# Patient Record
Sex: Male | Born: 1965 | Race: Black or African American | Hispanic: No | Marital: Married | Smoking: Former smoker
Health system: Southern US, Community
[De-identification: ages and names within clinical notes are randomized; demographics above are authoritative.]

## PROBLEM LIST (undated history)

## (undated) DIAGNOSIS — R569 Unspecified convulsions: Secondary | ICD-10-CM

## (undated) DIAGNOSIS — F419 Anxiety disorder, unspecified: Secondary | ICD-10-CM

---

## 2016-11-28 DIAGNOSIS — G40209 Localization-related (focal) (partial) symptomatic epilepsy and epileptic syndromes with complex partial seizures, not intractable, without status epilepticus: Secondary | ICD-10-CM | POA: Insufficient documentation

## 2017-02-27 DIAGNOSIS — M25562 Pain in left knee: Secondary | ICD-10-CM

## 2017-02-27 DIAGNOSIS — G8929 Other chronic pain: Secondary | ICD-10-CM | POA: Insufficient documentation

## 2017-02-27 DIAGNOSIS — M25561 Pain in right knee: Secondary | ICD-10-CM

## 2017-12-05 ENCOUNTER — Encounter (HOSPITAL_COMMUNITY): Payer: Self-pay | Admitting: Emergency Medicine

## 2017-12-05 ENCOUNTER — Emergency Department (HOSPITAL_COMMUNITY): Payer: Medicaid Other

## 2017-12-05 DIAGNOSIS — R079 Chest pain, unspecified: Secondary | ICD-10-CM | POA: Diagnosis present

## 2017-12-05 DIAGNOSIS — J189 Pneumonia, unspecified organism: Secondary | ICD-10-CM | POA: Diagnosis not present

## 2017-12-05 DIAGNOSIS — Z87891 Personal history of nicotine dependence: Secondary | ICD-10-CM | POA: Diagnosis not present

## 2017-12-05 LAB — CBC
HEMATOCRIT: 41.1 % (ref 39.0–52.0)
Hemoglobin: 15 g/dL (ref 13.0–17.0)
MCH: 30.9 pg (ref 26.0–34.0)
MCHC: 36.5 g/dL — AB (ref 30.0–36.0)
MCV: 84.7 fL (ref 78.0–100.0)
Platelets: 170 10*3/uL (ref 150–400)
RBC: 4.85 MIL/uL (ref 4.22–5.81)
RDW: 13.7 % (ref 11.5–15.5)
WBC: 6.6 10*3/uL (ref 4.0–10.5)

## 2017-12-05 LAB — BASIC METABOLIC PANEL
Anion gap: 14 (ref 5–15)
BUN: 10 mg/dL (ref 6–20)
CHLORIDE: 105 mmol/L (ref 101–111)
CO2: 19 mmol/L — ABNORMAL LOW (ref 22–32)
Calcium: 9.7 mg/dL (ref 8.9–10.3)
Creatinine, Ser: 0.82 mg/dL (ref 0.61–1.24)
GFR calc Af Amer: >60 mL/min (ref >60)
GFR calc non Af Amer: >60 mL/min (ref >60)
GLUCOSE: 118 mg/dL — AB (ref 65–99)
POTASSIUM: 3.3 mmol/L — AB (ref 3.5–5.1)
Sodium: 138 mmol/L (ref 135–145)

## 2017-12-05 LAB — I-STAT TROPONIN, ED: Troponin i, poc: 0.03 ng/mL (ref 0.00–0.08)

## 2017-12-05 NOTE — ED Notes (Signed)
Pt walking out to car and will return

## 2017-12-05 NOTE — ED Triage Notes (Signed)
Brought by ems for c/o central chest pain.  Describes as a throbbing.  Was seen at [facility] for the same earlier today.  Was told everything was okay and discharged.  Denies having any pain at this time.  Does endorse smoking some synthetic marijuana this past weekend and has felt bad since.

## 2017-12-05 NOTE — ED Notes (Signed)
Brought back for reassessment.  Reports walking to the car.  Per family had to "take it slow" then fell back into the chair when they returned to the waiting room.  Per patient continuing to have chest pain.  Encouraged to stay seated in the waiting room until he can get a room.

## 2017-12-05 NOTE — ED Notes (Addendum)
Pt was lying on ground outside on pavement coming down hill to ED.  Visitor reports that pt had increased sob while walking back from car and said he needed to sit down.  States pt sat down on pavement and then layed down.  Denies patient falling or hitting head.  Pt states he has continued chest pain and sob.  Pt taken back to triage for repeat EKG.

## 2017-12-06 ENCOUNTER — Emergency Department (HOSPITAL_COMMUNITY)
Admission: EM | Admit: 2017-12-06 | Discharge: 2017-12-06 | Disposition: A | Discharge status: Home / Self Care | Payer: Medicaid Other | Attending: Emergency Medicine | Admitting: Emergency Medicine

## 2017-12-06 ENCOUNTER — Emergency Department (HOSPITAL_COMMUNITY): Payer: Medicaid Other

## 2017-12-06 DIAGNOSIS — R079 Chest pain, unspecified: Secondary | ICD-10-CM

## 2017-12-06 DIAGNOSIS — R0602 Shortness of breath: Secondary | ICD-10-CM

## 2017-12-06 DIAGNOSIS — R042 Hemoptysis: Secondary | ICD-10-CM

## 2017-12-06 DIAGNOSIS — J181 Lobar pneumonia, unspecified organism: Secondary | ICD-10-CM

## 2017-12-06 DIAGNOSIS — J189 Pneumonia, unspecified organism: Secondary | ICD-10-CM

## 2017-12-06 HISTORY — DX: Unspecified convulsions: R56.9

## 2017-12-06 LAB — HEPATIC FUNCTION PANEL
ALBUMIN: 4 g/dL (ref 3.5–5.0)
ALK PHOS: 146 U/L — AB (ref 38–126)
ALT: 30 U/L (ref 17–63)
AST: 26 U/L (ref 15–41)
Bilirubin, Direct: <0.1 mg/dL — ABNORMAL LOW (ref 0.1–0.5)
TOTAL PROTEIN: 7.5 g/dL (ref 6.5–8.1)
Total Bilirubin: 0.6 mg/dL (ref 0.3–1.2)

## 2017-12-06 LAB — LIPASE, BLOOD: Lipase: 28 U/L (ref 11–51)

## 2017-12-06 LAB — I-STAT TROPONIN, ED: Troponin i, poc: 0.01 ng/mL (ref 0.00–0.08)

## 2017-12-06 MED ORDER — HYDROCODONE-ACETAMINOPHEN 5-325 MG PO TABS: 1.0000 | ORAL_TABLET | ORAL | 0 refills | Status: DC | PRN

## 2017-12-06 MED ORDER — LEVOFLOXACIN 750 MG PO TABS: 750.0000 mg | ORAL_TABLET | Freq: Every day | ORAL | 0 refills | Status: AC

## 2017-12-06 MED ORDER — IOPAMIDOL (ISOVUE-370) INJECTION 76%
100.0000 mL | Freq: Once | INTRAVENOUS | Status: AC | PRN
  Administered 2017-12-06: 100 mL via INTRAVENOUS

## 2017-12-06 MED ORDER — IOPAMIDOL (ISOVUE-370) INJECTION 76%
INTRAVENOUS | Status: AC
  Filled 2017-12-06: qty 100

## 2017-12-06 MED ORDER — GI COCKTAIL ~~LOC~~
30.0000 mL | Freq: Once | ORAL | Status: AC
  Administered 2017-12-06: 30 mL via ORAL
  Filled 2017-12-06: qty 30

## 2017-12-06 MED ORDER — LEVOFLOXACIN 750 MG PO TABS
750.0000 mg | ORAL_TABLET | Freq: Once | ORAL | Status: AC
  Administered 2017-12-06: 750 mg via ORAL
  Filled 2017-12-06: qty 1

## 2017-12-06 MED ORDER — NITROGLYCERIN 0.4 MG SL SUBL: 0.4000 mg | SUBLINGUAL_TABLET | SUBLINGUAL | Status: DC | PRN

## 2017-12-06 NOTE — Discharge Instructions (Signed)
Your workup today showed evidence of pneumonia.  I suspect is the cause of the chest pain, shortness of breath, and coughing up blood.  We did not find evidence of blood clot, injury to your esophagus, or evidence of an acute heart injury.  We had a shared decision making conversation about admitting you versus discharge given your elevated risks for heart trouble however, given the discovery of pneumonia and your improvement in symptoms we decided you are safe for discharge home.  Please follow-up with a primary doctor in the next few days and return to the nearest emergency department if any symptoms change or worsen.

## 2017-12-06 NOTE — ED Notes (Signed)
Pt ambulatory in hallway with no assistance. SpO2 97% before ambulating, fell to 93% while ambulating, returned to 97% at bedside.

## 2017-12-06 NOTE — ED Notes (Signed)
EDP at bedside at this time.  

## 2017-12-06 NOTE — ED Notes (Signed)
E-signature not functioning at this time.  

## 2017-12-06 NOTE — ED Provider Notes (Signed)
[facility] EMERGENCY DEPARTMENT Provider Note   CSN: [id number] Arrival date & time: 12/05/17  2124     History   Chief Complaint Chief Complaint  Patient presents with  . Chest Pain    HPI Adam Kramer is a 52 y.o. male.  The history is provided by the patient and medical records. No language interpreter was used.  Chest Pain   This is a new problem. The current episode started more than 2 days ago. The problem occurs constantly. The problem has not changed since onset.The pain is associated with exertion, coughing, breathing and eating. The pain is present in the substernal region. The pain is at a severity of 9/10. The pain is severe. The quality of the pain is described as sharp and stabbing. The pain radiates to the left neck and right neck. Duration of episode(s) is 4 days. The symptoms are aggravated by exertion and deep breathing. Associated symptoms include cough, diaphoresis, hemoptysis, irregular heartbeat, palpitations and shortness of breath. Pertinent negatives include no abdominal pain, no back pain, no exertional chest pressure, no fever, no headaches, no lower extremity edema, no malaise/fatigue, no nausea, no numbness, no syncope and no vomiting. He has tried nothing for the symptoms. The treatment provided no relief. Risk factors include male gender and smoking/tobacco exposure.    Past Medical History:  Diagnosis Date  . Seizures (HCC)     There are no active problems to display for this patient.   History reviewed. No pertinent surgical history.      Home Medications    Prior to Admission medications   Not on File    Family History No family history on file.  Social History Social History   Tobacco Use  . Smoking status: Former Smoker    Types: Cigarettes    Last attempt to quit: 12/02/2017    Years since quitting: 0.0  . Smokeless tobacco: Never Used  Substance Use Topics  . Alcohol use: Yes    Comment: occasionally  .  Drug use: Yes    Comment: synthetic marijuana     Allergies   Patient has no allergy information on record.   Review of Systems Review of Systems  Constitutional: Positive for chills and diaphoresis. Negative for fatigue, fever and malaise/fatigue.  HENT: Negative for congestion.   Eyes: Negative for visual disturbance.  Respiratory: Positive for cough, hemoptysis, chest tightness and shortness of breath. Negative for choking, wheezing and stridor.   Cardiovascular: Positive for chest pain and palpitations. Negative for leg swelling and syncope.  Gastrointestinal: Negative for abdominal pain, diarrhea, nausea and vomiting.  Genitourinary: Negative for dysuria.  Musculoskeletal: Positive for neck pain. Negative for back pain and neck stiffness.  Neurological: Negative for light-headedness, numbness and headaches.  Psychiatric/Behavioral: Negative for agitation.  All other systems reviewed and are negative.    Physical Exam Updated Vital Signs BP (!) 161/93   Pulse 81   Temp 97.9 F (36.6 C) (Oral)   Resp 18   Ht 5\' 11"  (1.803 m)   Wt 79.4 kg (175 lb)   SpO2 100%   BMI 24.41 kg/m   Physical Exam  Constitutional: He is oriented to person, place, and time. He appears well-developed and well-nourished. No distress.  HENT:  Head: Normocephalic.  Nose: Nose normal.  Mouth/Throat: Oropharynx is clear and moist.  Eyes: Pupils are equal, round, and reactive to light. Conjunctivae and EOM are normal.  Neck: Normal range of motion. Neck supple.  Cardiovascular: Normal rate and  intact distal pulses.  No murmur heard. Pulmonary/Chest: Effort normal and breath sounds normal. No stridor. No respiratory distress. He has no wheezes. He exhibits no tenderness.  Abdominal: Soft. Bowel sounds are normal. He exhibits no distension. There is no tenderness.  Musculoskeletal: Normal range of motion. He exhibits no edema or tenderness.  Neurological: He is alert and oriented to person,  place, and time. No sensory deficit. He exhibits normal muscle tone.  Skin: Skin is warm. Capillary refill takes less than 2 seconds. He is not diaphoretic. No erythema. No pallor.  Psychiatric: He has a normal mood and affect.  Nursing note and vitals reviewed.    ED Treatments / Results  Labs (all labs ordered are listed, but only abnormal results are displayed) Labs Reviewed  BASIC METABOLIC PANEL - Abnormal; Notable for the following components:      Result Value   Potassium 3.3 (*)    CO2 19 (*)    Glucose, Bld 118 (*)    All other components within normal limits  CBC - Abnormal; Notable for the following components:   MCHC 36.5 (*)    All other components within normal limits  HEPATIC FUNCTION PANEL  LIPASE, BLOOD  I-STAT TROPONIN, ED  I-STAT TROPONIN, ED    EKG EKG Interpretation  Date/Time:  Wednesday December 05 2017 23:35:26 EDT Ventricular Rate:  83 PR Interval:  156 QRS Duration: 96 QT Interval:  354 QTC Calculation: 415 R Axis:   60 Text Interpretation:  Normal sinus rhythm Right atrial enlargement Left ventricular hypertrophy T wave abnormality, consider inferior ischemia Abnormal ECG When comapred to prior, new t wave inversin in lead AVF.  No STEMI Confirmed by Theda Belfast ([id number]) on 12/06/2017 7:24:03 AM   Radiology Dg Chest 2 View  Result Date: 12/05/2017 CLINICAL DATA:  Chest pain EXAM: CHEST - 2 VIEW COMPARISON:  None. FINDINGS: Borderline cardiomegaly. Both lungs are clear. The visualized skeletal structures are unremarkable. IMPRESSION: No active cardiopulmonary disease. Electronically Signed   By: Jasmine Pang M.D.   On: 12/05/2017 22:19   Ct Angio Chest Pe W And/or Wo Contrast  Result Date: 12/06/2017 CLINICAL DATA:  Shortness of breath. Pulmonary embolism suspected. Cough and hemoptysis after smoking synthetic cannabis EXAM: CT ANGIOGRAPHY CHEST WITH CONTRAST TECHNIQUE: Multidetector CT imaging of the chest was performed using the standard  protocol during bolus administration of intravenous contrast. Multiplanar CT image reconstructions and MIPs were obtained to evaluate the vascular anatomy. CONTRAST:  ISOVUE-370 IOPAMIDOL (ISOVUE-370) INJECTION 76% COMPARISON:  None. FINDINGS: Cardiovascular: Satisfactory opacification of the pulmonary arteries to the segmental level. No evidence of pulmonary embolism. Normal heart size. No pericardial effusion. Atherosclerotic calcifications seen along the left circumflex. Mediastinum/Nodes: Right hilar adenopathy, presumably related to the ipsilateral airspace disease. Nodes measure up to 13 mm in short axis Lungs/Pleura: Clustered airspace nodules in the right lower lobe. There is inhalational history but would expect more generalized airspace disease for a pneumonitis. Mild paraseptal emphysema. Mild generalized increased density of the lower lobes favoring atelectasis. Upper Abdomen: Negative Musculoskeletal: No acute finding Review of the MIP images confirms the above findings. IMPRESSION: 1. Right lower lobe airspace disease which could reflect pneumonia or alveolar hemorrhage given the clinical history. Ipsilateral hilar adenopathy favors an infectious/inflammatory process. These findings are radiographic occult but chest x-ray follow-up in 3-4 weeks is still recommended. 2. Coronary atherosclerotic calcification, mild but notable for age. Electronically Signed   By: Marnee Spring M.D.   On: 12/06/2017 08:50  Procedures Procedures (including critical care time)  Medications Ordered in ED Medications  gi cocktail (Maalox,Lidocaine,Donnatal) (30 mLs Oral Given 12/06/17 0748)  iopamidol (ISOVUE-370) 76 % injection 100 mL (100 mLs Intravenous Contrast Given 12/06/17 0820)  levofloxacin (LEVAQUIN) tablet 750 mg (750 mg Oral Given 12/06/17 1115)     Initial Impression / Assessment and Plan / ED Course  I have reviewed the triage vital signs and the nursing notes.  Pertinent labs & imaging  results that were available during my care of the patient were reviewed by me and considered in my medical decision making (see chart for details).     Josie SaundersLacy Makin is a 52 y.o. male with a past medical history significant for seizures and hypertension who presents with chest pain.  Patient reports that for the last few days he has had severe chest pain and shortness of breath.  He does report that he smoked a synthetic marijuana several days ago and his symptoms have been persistent since that time.  He reports it is worse with eating and drinking and he has had severe coughing episodes with hemoptysis.  He describes his pain as 10 out of 10 and is primarily sharp.  He says it is also exertional and pleuritic.  He denies any history of DVT or PE.  He denies any prior trauma.  He does report some palpitations associated.  He also reports some chills but no fevers.  He reports his pain gets up to a 10 out of 10 severity but is currently 9 out of 10.  He denies nausea, vomiting but does report some diaphoresis.  He reports the pain radiates into his neck.  He reports that he has had increase in seizures over the last few days but has had no trauma to his knowledge.  He reports going to an urgent care several days ago and had a positive troponin and was given a nitroglycerin patch that improved his pain.  He reports that he was seen yesterday at a different hospital and had several troponins that were negative and patient was discharged with reassurances.  Patient reports his pain is continued and he is having difficulty with ambulation due to the chest pain shortness of breath prompting him to seek evaluation.  Next  On exam, patient did not have crepitance in his neck or chest.  Lungs were clear.  Chest was nontender.  Patient had no lower extremity edema or abnormal pulses in his arms or legs.  Abdomen was nontender chest was nontender.  Patient overall appears well.  When compared to prior EKG patient had a  new T wave inversion.  No STEMI was seen.   Patient's initial CBC shows no anemia or leukocytosis.  Metabolic panel showed slight hypokalemia but otherwise normal kidney function.  Initial troponin was negative.  Next  Based on patient's report of hemoptysis and chest pain that is sharp after having the coughing fits from the synthetic marijuana, I am concerned patient may have had a esophageal or airway injury.  Patient will have CT scan to further evaluate.  Patient will also have a lipase and hepatic function panel given the upper abdominal/lower chest pain.    Anticipate reassessment after workup however patient will be given a GI cocktail and nitroglycerin.  Patient reports his pain is improved during his ED stay.  CT scan revealed evidence of pneumonia with the alveolar hemorrhage likely causing the hemoptysis.  No evidence of pulmonary embolism seen.  Delta troponin was negative.  Patient  given antibiotics for the pneumonia.  I suspect this is the cause of his symptoms.  A shared decision making conversation was held as to admission given a heart score of 4 despite his negative delta troponin and patient would rather be discharged home.  Given his reassuring vital signs and improved symptoms do not feel patient needs admission.  Patient will follow up with his PCP for reassessment and further management.  Patient will be given prescription for antibiotics for the pneumonia.  Patient understood extremely strict return precautions.  Patient had no other questions or concerns and was discharged in good condition for outpatient management of his pneumonia and chest pain.    Final Clinical Impressions(s) / ED Diagnoses   Final diagnoses:  Chest pain, unspecified type  Community acquired pneumonia of right lower lobe of lung (HCC)  Hemoptysis  Shortness of breath    ED Discharge Orders        Ordered    levofloxacin (LEVAQUIN) 750 MG tablet  Daily     12/06/17 1124     HYDROcodone-acetaminophen (NORCO/VICODIN) 5-325 MG tablet  Every 4 hours PRN     12/06/17 1124      Clinical Impression: 1. Chest pain, unspecified type   2. Community acquired pneumonia of right lower lobe of lung (HCC)   3. Hemoptysis   4. Shortness of breath     Disposition: Discharge  Condition: Good  I have discussed the results, Dx and Tx plan with the pt(& family if present). He/she/they expressed understanding and agree(s) with the plan. Discharge instructions discussed at great length. Strict return precautions discussed and pt &/or family have verbalized understanding of the instructions. No further questions at time of discharge.    Discharge Medication List as of 12/06/2017 11:26 AM    START taking these medications   Details  HYDROcodone-acetaminophen (NORCO/VICODIN) 5-325 MG tablet Take 1 tablet by mouth every 4 (four) hours as needed., Starting Thu 12/06/2017, Print    levofloxacin (LEVAQUIN) 750 MG tablet Take 1 tablet (750 mg total) by mouth daily for 4 days., Starting Fri 12/07/2017, Until Tue 12/11/2017, Print        Follow Up: [facility] [address] [place] [address] [phone] Schedule an appointment as soon as possible for a visit    [facility] EMERGENCY DEPARTMENT [address] [id number] [address]c [place] [address] [phone]       Jakeem Grape, Canary Brim, MD 12/06/17 Barry Brunner

## 2018-02-12 ENCOUNTER — Other Ambulatory Visit: Payer: Self-pay

## 2018-02-12 ENCOUNTER — Emergency Department (HOSPITAL_COMMUNITY)
Admission: EM | Admit: 2018-02-12 | Discharge: 2018-02-13 | Disposition: A | Discharge status: Home / Self Care | Payer: Medicaid Other | Attending: Emergency Medicine | Admitting: Emergency Medicine

## 2018-02-12 DIAGNOSIS — R569 Unspecified convulsions: Secondary | ICD-10-CM

## 2018-02-12 DIAGNOSIS — Z79899 Other long term (current) drug therapy: Secondary | ICD-10-CM | POA: Insufficient documentation

## 2018-02-12 DIAGNOSIS — Z87891 Personal history of nicotine dependence: Secondary | ICD-10-CM | POA: Insufficient documentation

## 2018-02-12 LAB — BASIC METABOLIC PANEL
Anion gap: 9 (ref 5–15)
BUN: 15 mg/dL (ref 6–20)
CHLORIDE: 108 mmol/L (ref 101–111)
CO2: 22 mmol/L (ref 22–32)
Calcium: 9.3 mg/dL (ref 8.9–10.3)
Creatinine, Ser: 0.96 mg/dL (ref 0.61–1.24)
GFR calc Af Amer: >60 mL/min (ref >60)
GFR calc non Af Amer: >60 mL/min (ref >60)
GLUCOSE: 92 mg/dL (ref 65–99)
POTASSIUM: 3.8 mmol/L (ref 3.5–5.1)
Sodium: 139 mmol/L (ref 135–145)

## 2018-02-12 LAB — PHENYTOIN LEVEL, TOTAL: Phenytoin Lvl: 15.6 ug/mL (ref 10.0–20.0)

## 2018-02-12 MED ORDER — LORAZEPAM 1 MG PO TABS
1.0000 mg | ORAL_TABLET | ORAL | Status: AC
  Administered 2018-02-12: 1 mg via ORAL
  Filled 2018-02-12: qty 1

## 2018-02-12 NOTE — ED Triage Notes (Addendum)
Patient has hx of seizures and had a breakthrough seizure today. Had a medication decrease about 30 days ago. NAD, alert and oriented x4. This is the patient's 6th breakthrough seizure in 5 days.

## 2018-02-13 ENCOUNTER — Emergency Department (HOSPITAL_COMMUNITY): Payer: Medicaid Other
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
## 2018-02-13 NOTE — Discharge Instructions (Signed)
It is important that you take your Dilantin no sooner than 2 hours before or after you take Carafate.

## 2018-02-15 ENCOUNTER — Ambulatory Visit: Payer: Medicaid Other | Admitting: Neurology

## 2018-02-20 ENCOUNTER — Encounter: Payer: Self-pay | Admitting: Neurology

## 2018-02-20 ENCOUNTER — Ambulatory Visit: Payer: Medicaid Other | Admitting: Neurology

## 2018-02-20 ENCOUNTER — Other Ambulatory Visit: Payer: Self-pay

## 2018-02-20 VITALS — BP 147/89 | HR 77 | Resp 18 | Ht 69.0 in | Wt 169.0 lb

## 2018-02-20 DIAGNOSIS — G8929 Other chronic pain: Secondary | ICD-10-CM | POA: Diagnosis not present

## 2018-02-20 DIAGNOSIS — R1310 Dysphagia, unspecified: Secondary | ICD-10-CM | POA: Insufficient documentation

## 2018-02-20 DIAGNOSIS — G40209 Localization-related (focal) (partial) symptomatic epilepsy and epileptic syndromes with complex partial seizures, not intractable, without status epilepticus: Secondary | ICD-10-CM | POA: Diagnosis not present

## 2018-02-20 DIAGNOSIS — M25561 Pain in right knee: Secondary | ICD-10-CM | POA: Diagnosis not present

## 2018-02-20 DIAGNOSIS — M25562 Pain in left knee: Secondary | ICD-10-CM | POA: Diagnosis not present

## 2018-02-20 MED ORDER — TOPIRAMATE 50 MG PO TABS: 100.0000 mg | ORAL_TABLET | Freq: Two times a day (BID) | ORAL | 5 refills | Status: DC

## 2018-02-20 MED ORDER — TOPIRAMATE 100 MG PO TABS: 100.0000 mg | ORAL_TABLET | Freq: Two times a day (BID) | ORAL | 5 refills | Status: DC

## 2018-02-20 MED ORDER — PHENYTOIN 125 MG/5ML PO SUSP: ORAL | 5 refills | Status: DC

## 2018-02-20 MED ORDER — TOPIRAMATE 100 MG PO TABS: 100.0000 mg | ORAL_TABLET | Freq: Two times a day (BID) | ORAL | 5 refills | Status: AC
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
## 2018-02-25 ENCOUNTER — Telehealth: Payer: Self-pay | Admitting: Neurology

## 2018-02-25 NOTE — Telephone Encounter (Signed)
Because Of patient's insurance his PCP will have to do referral for  GASTROENTEROLOGY patient and his wife are aware they will call PCP.

## 2018-03-26 ENCOUNTER — Telehealth: Payer: Self-pay | Admitting: Neurology

## 2018-03-26 DIAGNOSIS — G40209 Localization-related (focal) (partial) symptomatic epilepsy and epileptic syndromes with complex partial seizures, not intractable, without status epilepticus: Secondary | ICD-10-CM

## 2018-03-26 NOTE — Addendum Note (Signed)
Addended by: Candis SchatzMISENHEIMER, Barbera Perritt I on: 03/26/2018 04:32 PM   Modules accepted: Orders

## 2018-03-26 NOTE — Telephone Encounter (Signed)
I spoke with Dr. Lubertha BasqueLlona Humes' office.  She is a neurologist who specializesin sz. d/o's.  Phone# [phone], fax# [phone]. she is accepting new pt's.  Referral ordered in Epic. I spoke with Cammie and let her know this has been taken care of/fim

## 2018-03-26 NOTE — Telephone Encounter (Signed)
Patient's [place] calling to get a referral to Dr. Lubertha BasqueLlona Humes a neurologist in [place]. Her telephone number is [phone]. [place].

## 2018-03-27 ENCOUNTER — Ambulatory Visit: Payer: Medicaid Other | Admitting: Neurology

## 2018-03-28 ENCOUNTER — Encounter: Payer: Self-pay | Admitting: Neurology

## 2018-04-02 ENCOUNTER — Emergency Department (HOSPITAL_COMMUNITY)
Admission: EM | Admit: 2018-04-02 | Discharge: 2018-04-02 | Disposition: A | Discharge status: Home / Self Care | Payer: Medicaid Other | Attending: Emergency Medicine | Admitting: Emergency Medicine

## 2018-04-02 ENCOUNTER — Encounter (HOSPITAL_COMMUNITY): Payer: Self-pay | Admitting: Emergency Medicine

## 2018-04-02 DIAGNOSIS — T420X1A Poisoning by hydantoin derivatives, accidental (unintentional), initial encounter: Secondary | ICD-10-CM | POA: Diagnosis not present

## 2018-04-02 DIAGNOSIS — Z87891 Personal history of nicotine dependence: Secondary | ICD-10-CM | POA: Diagnosis not present

## 2018-04-02 DIAGNOSIS — Z79899 Other long term (current) drug therapy: Secondary | ICD-10-CM | POA: Insufficient documentation

## 2018-04-02 DIAGNOSIS — R1084 Generalized abdominal pain: Secondary | ICD-10-CM

## 2018-04-02 HISTORY — DX: Anxiety disorder, unspecified: F41.9

## 2018-04-02 LAB — COMPREHENSIVE METABOLIC PANEL
ALK PHOS: 116 U/L (ref 38–126)
ALT: 26 U/L (ref 0–44)
AST: 18 U/L (ref 15–41)
Albumin: 4.1 g/dL (ref 3.5–5.0)
Anion gap: 10 (ref 5–15)
BUN: 15 mg/dL (ref 6–20)
CALCIUM: 9.1 mg/dL (ref 8.9–10.3)
CHLORIDE: 109 mmol/L (ref 98–111)
CO2: 20 mmol/L — AB (ref 22–32)
CREATININE: 0.85 mg/dL (ref 0.61–1.24)
GFR calc non Af Amer: >60 mL/min (ref >60)
Glucose, Bld: 98 mg/dL (ref 70–99)
Potassium: 3.6 mmol/L (ref 3.5–5.1)
SODIUM: 139 mmol/L (ref 135–145)
Total Bilirubin: 0.4 mg/dL (ref 0.3–1.2)
Total Protein: 7.4 g/dL (ref 6.5–8.1)

## 2018-04-02 LAB — URINALYSIS, ROUTINE W REFLEX MICROSCOPIC
BILIRUBIN URINE: NEGATIVE
GLUCOSE, UA: NEGATIVE mg/dL
HGB URINE DIPSTICK: NEGATIVE
KETONES UR: NEGATIVE mg/dL
Leukocytes, UA: NEGATIVE
Nitrite: NEGATIVE
PROTEIN: NEGATIVE mg/dL
Specific Gravity, Urine: 1.009 (ref 1.005–1.030)
pH: 6 (ref 5.0–8.0)

## 2018-04-02 LAB — CBC
HCT: 40 % (ref 39.0–52.0)
Hemoglobin: 14.3 g/dL (ref 13.0–17.0)
MCH: 30.6 pg (ref 26.0–34.0)
MCHC: 35.8 g/dL (ref 30.0–36.0)
MCV: 85.5 fL (ref 78.0–100.0)
PLATELETS: 161 10*3/uL (ref 150–400)
RBC: 4.68 MIL/uL (ref 4.22–5.81)
RDW: 12.3 % (ref 11.5–15.5)
WBC: 5.7 10*3/uL (ref 4.0–10.5)

## 2018-04-02 LAB — PHENYTOIN LEVEL, TOTAL
PHENYTOIN LVL: 34.1 ug/mL — AB (ref 10.0–20.0)
Phenytoin Lvl: 31.9 ug/mL (ref 10.0–20.0)

## 2018-04-02 LAB — LIPASE, BLOOD: Lipase: 33 U/L (ref 11–51)

## 2018-04-02 MED ORDER — ONDANSETRON HCL 4 MG/2ML IJ SOLN
4.0000 mg | Freq: Once | INTRAMUSCULAR | Status: AC
  Administered 2018-04-02: 4 mg via INTRAVENOUS
  Filled 2018-04-02: qty 2

## 2018-04-02 MED ORDER — ACETAMINOPHEN 500 MG PO TABS
1000.0000 mg | ORAL_TABLET | Freq: Once | ORAL | Status: AC
  Administered 2018-04-02: 1000 mg via ORAL
  Filled 2018-04-02: qty 2

## 2018-04-02 MED ORDER — SODIUM CHLORIDE 0.9 % IV BOLUS
1000.0000 mL | Freq: Once | INTRAVENOUS | Status: AC
  Administered 2018-04-02: 1000 mL via INTRAVENOUS

## 2018-04-02 MED ORDER — HYDRALAZINE HCL 20 MG/ML IJ SOLN: 10.0000 mg | Freq: Once | INTRAMUSCULAR | Status: DC

## 2018-04-02 MED ORDER — BISACODYL 5 MG PO TBEC
10.0000 mg | DELAYED_RELEASE_TABLET | Freq: Once | ORAL | Status: AC
  Administered 2018-04-02: 10 mg via ORAL
  Filled 2018-04-02: qty 2

## 2018-04-02 NOTE — Discharge Instructions (Addendum)
Please stop taking your Phenytoin medication and follow-up with your primary care doctor tomorrow for further management of your anti-seizure medication.  Please take one Dulcolax (Generic Name: Bisacodyl) 5 mg tablet once daily as needed for constipation.

## 2018-04-02 NOTE — ED Provider Notes (Signed)
[facility] EMERGENCY DEPARTMENT Provider Note   CSN: [id number] Arrival date & time: 04/02/18  1339     History   Chief Complaint Chief Complaint  Patient presents with  . Abdominal Pain    HPI Adam Kramer is a 52 y.o. male with a history of complex partial seizures and anxiety who presents with abdominal pain.   Patient states that he had a seizure Saturday morning. Patient describes his seizures as gritting his teeth, drooling, and getting stiff. He denies LOC. Patient reports that after since his seizure he has had intermittent centrally located abdominal pain and left-sided non-exertional, non-pleuritic chest pain. He also states that he has had several episodes of light-headedness and has "fallen over" several times with his wife catching him overtime. He denies LOC or hitting head.   He was seen this morning at a [facility], where he was given IVF's and had an EKG showing NSR with LVH. A head CT scan was ordered and per the ED note "he jumped off of the bed and ran out of CT back to his ED room. Patient was hyperventilating likely having panic attack." The patient left AMA with "IV in." Patient reports that the tech "left him in the CT for too long and his head was burning." He reports immediately leaving and driving straight here.   Patient reports that he takes Dilantin and Topemax. He denies missing any doses and does not believe that he is taking too much of any particular medication. He states that he was recently switched to liquid Dilantin and Topemax because he had difficulty swallowing pills but was then changed back to oral pills.   Patient reports several days of constipation. He denies fevers and SOB.    Past Medical History:  Diagnosis Date  . Anxiety   . Seizures [facility])     Patient Active Problem List   Diagnosis Date Noted  . Dysphagia 02/20/2018  . Bilateral chronic knee pain 02/27/2017  . Complex partial seizure evolving to  generalized seizure (HCC) 11/28/2016    History reviewed. No pertinent surgical history.      Home Medications    Prior to Admission medications   Medication Sig Start Date End Date Taking? Authorizing Provider  amLODipine (NORVASC) 10 MG tablet Take 10 mg by mouth daily. 11/15/17   [provider]  cyclobenzaprine (FLEXERIL) 10 MG tablet Take 10 mg by mouth 3 (three) times daily as needed for muscle spasms.  01/16/18   [provider]  pantoprazole (PROTONIX) 40 MG tablet Take 40 mg by mouth daily. 02/09/18   [provider]  phenytoin (DILANTIN) 125 MG/5ML suspension 10 ml po bid 02/20/18   Sater, Pearletha Furl, MD  sucralfate (CARAFATE) 1 g tablet Take 1 g by mouth 4 (four) times daily. 02/11/18 02/11/19  [provider]  topiramate (TOPAMAX) 100 MG tablet Take 1 tablet (100 mg total) by mouth 2 (two) times daily. 02/20/18   Sater, Pearletha Furl, MD    Family History No family history on file.  Social History Social History   Tobacco Use  . Smoking status: Former Smoker    Types: Cigarettes    Last attempt to quit: 12/02/2017    Years since quitting: 0.3  . Smokeless tobacco: Never Used  Substance Use Topics  . Alcohol use: Yes    Comment: occasionally  . Drug use: Yes    Comment: synthetic marijuana     Allergies   Levetiracetam and Mushroom extract complex  Review of Systems Review of Systems  Constitutional: Negative for activity change, appetite change, chills, fatigue and fever.  HENT: Negative for congestion and sore throat.   Eyes: Negative for visual disturbance.  Respiratory: Negative for cough, chest tightness and shortness of breath.   Cardiovascular: Positive for chest pain. Negative for leg swelling.  Gastrointestinal: Positive for abdominal pain and constipation. Negative for abdominal distention, diarrhea, nausea and vomiting.  Genitourinary: Negative for dysuria, hematuria and urgency.  Skin: Negative for rash.  Neurological:  Negative for dizziness, numbness and headaches.  All other systems reviewed and are negative.    Physical Exam Updated Vital Signs BP (!) 159/109 (BP Location: Right Arm)   Pulse 85   Temp 98.8 F (37.1 C) (Oral)   Resp 16   SpO2 100%   Physical Exam  Constitutional: He appears well-developed and well-nourished.  HENT:  Head: Normocephalic and atraumatic.  Eyes: EOM are normal.  Cardiovascular: Normal rate and regular rhythm.  Pulmonary/Chest: Effort normal and breath sounds normal.  Abdominal: Soft. Normal appearance and bowel sounds are normal. There is generalized tenderness.  Neurological: He is alert. He has normal strength and normal reflexes. No cranial nerve deficit or sensory deficit.  Skin: Skin is warm and dry.  Psychiatric: He has a normal mood and affect. His behavior is normal.  Nursing note and vitals reviewed.    ED Treatments / Results  Labs (all labs ordered are listed, but only abnormal results are displayed) Labs Reviewed  COMPREHENSIVE METABOLIC PANEL - Abnormal; Notable for the following components:      Result Value   CO2 20 (*)    All other components within normal limits  URINALYSIS, ROUTINE W REFLEX MICROSCOPIC - Abnormal; Notable for the following components:   Color, Urine STRAW (*)    All other components within normal limits  PHENYTOIN LEVEL, TOTAL - Abnormal; Notable for the following components:   Phenytoin Lvl 34.1 (*)    All other components within normal limits  LIPASE, BLOOD  CBC  PHENYTOIN LEVEL, TOTAL    EKG EKG Interpretation  Date/Time:  Tuesday April 02 2018 13:52:54 EDT Ventricular Rate:  84 PR Interval:  156 QRS Duration: 86 QT Interval:  354 QTC Calculation: 418 R Axis:   53 Text Interpretation:  Normal sinus rhythm Moderate voltage criteria for LVH, may be normal variant Borderline ECG flipped t waves in the lateral leads and aVF now resolved Otherwise no significant change Confirmed by Melene PlanFloyd, Dan [id number]) on  04/02/2018 3:33:32 PM   Radiology  04/02/2018 11:57 AM EDT: Outside hospital Head CT without intravenous contrast:  HISTORY: Acute dizziness. Possible seizure.  TECHNIQUE: Thin axial CT imaging of the head was performed. No intravenous contrast was administered. This CT examination was performed using one or more of the following postreduction techniques: Automated exposure control, adjustment of the MA or and/or kV according to the patient's size, or use of iterative reconstruction technique. Compared to head CT 01/19/2018.  FINDINGS: No hemorrhage, mass, midline shift or extra-axial fluid collection. Ventricles and subarachnoid preserved stable and age appropriate. No evidence for acute infarction. Mastoid air cells and middle ears are clear. The calvarium is intact. Visualized orbits and paranasal sinuses are unremarkable.  IMPRESSION Stable, unremarkable noncontrast head CT.  Procedures Procedures (including critical care time)  Medications Ordered in ED Medications - No data to display   Initial Impression / Assessment and Plan / ED Course  I have reviewed the triage vital signs and the nursing notes.  Pertinent  labs & imaging results that were available during my care of the patient were reviewed by me and considered in my medical decision making (see chart for details).  52 y.o. male with a history of complex partial seizures and anxiety who presents with abdominal pain. Patient was found to have an unremarkable CBC, CMP, and UA. Lipase was within normal limits. Outside CT showed no acute abnormalities. Patient's symptoms are likely secondary to phenytoin overdose (34 on presentation). Patient was initially hypertensive (159/105) on arrival with otherwise unremarkable vital signs. [facility] was consulted for further management. Poison Control recommended fluid resuscitation as well as discontinuing phenytoin with close follow up with PCP. Patient was given 1 L IVF  with improvement of blood pressure (120's/90's). Patient was also given tylenol, Zofran, and dulcolax with improvement of abdominal pain. Phenytoin level was repeated after completion of IVF's and was found to have decreased to 31. Patient was recommended to stop his phenytoin medication. He reports that he has a follow-up visit with his PCP tomorrow and will speak to them about his anti-seizure medications. Return precautions were given and patient verbalized understanding/agreement.      Final Clinical Impressions(s) / ED Diagnoses   Final diagnoses:  Generalized abdominal pain  Accidental phenytoin overdose, initial encounter    ED Discharge Orders    None       Synetta Shadow, MD 04/02/18 2059    Melene Plan, DO 04/03/18 1504

## 2018-04-02 NOTE — ED Notes (Addendum)
Pt has dilantin level of 34. MD [place] informed

## 2018-04-02 NOTE — ED Triage Notes (Signed)
Patient complains of abdominal pain and dizziness since Saturday. Patient alert and oriented at this time. [place]. When asked what happened there, patient became tearful and stated "they left my in cat scan for too long". Patient alert and oriented at this time.

## 2018-04-02 NOTE — ED Provider Notes (Signed)
Patient placed in Quick Look pathway, seen and evaluated   Chief Complaint: weakness  HPI:   Pt was in Ed before here.  Pt left ama.  Pt was told dilantin level was high  ROS: weak abdominal pain  Physical Exam:   Gen: No distress  Neuro: Awake and Alert  Skin: Warm    Focused Exam: Lungs clear, Heart rrr   Initiation of care has begun. The patient has been counseled on the process, plan, and necessity for staying for the completion/evaluation, and the remainder of the medical screening examination   Osie CheeksSofia, Chandelle Harkey K, PA-C 04/02/18 1402    Loren RacerYelverton, David, MD 04/04/18 [phone]

## 2018-04-23 MED ORDER — THIAMINE HCL 100 MG PO TABS
100.00 | ORAL_TABLET | ORAL | Status: DC
Start: 2018-04-24 — End: 2018-04-23

## 2018-04-23 MED ORDER — LORAZEPAM 2 MG/ML IJ SOLN
1.00 | INTRAMUSCULAR | Status: DC
Start: ? — End: 2018-04-23

## 2018-04-23 MED ORDER — PHENYTOIN SODIUM EXTENDED 100 MG PO CAPS
200.00 | ORAL_CAPSULE | ORAL | Status: DC
Start: 2018-04-24 — End: 2018-04-23

## 2018-04-23 MED ORDER — SALINE NASAL SPRAY 0.65 % NA SOLN
1.00 | NASAL | Status: DC
Start: ? — End: 2018-04-23

## 2018-04-23 MED ORDER — OXYMETAZOLINE HCL 0.05 % NA SOLN
1.00 | NASAL | Status: DC
Start: 2018-04-23 — End: 2018-04-23

## 2018-04-23 MED ORDER — TOPIRAMATE 100 MG PO TABS
100.00 | ORAL_TABLET | ORAL | Status: DC
Start: 2018-04-23 — End: 2018-04-23

## 2018-04-23 MED ORDER — PHENYTOIN SODIUM EXTENDED 100 MG PO CAPS
300.00 | ORAL_CAPSULE | ORAL | Status: DC
Start: 2018-04-23 — End: 2018-04-23

## 2018-04-23 MED ORDER — GENERIC EXTERNAL MEDICATION
2.00 | Status: DC
Start: ? — End: 2018-04-23

## 2018-04-23 MED ORDER — NICOTINE POLACRILEX 2 MG MT GUM
2.00 | CHEWING_GUM | OROMUCOSAL | Status: DC
Start: ? — End: 2018-04-23

## 2018-04-23 MED ORDER — HYDRALAZINE HCL 20 MG/ML IJ SOLN
10.00 | INTRAMUSCULAR | Status: DC
Start: ? — End: 2018-04-23

## 2018-04-23 MED ORDER — ENOXAPARIN SODIUM 40 MG/0.4ML ~~LOC~~ SOLN
40.00 | SUBCUTANEOUS | Status: DC
Start: 2018-04-23 — End: 2018-04-23

## 2018-04-23 MED ORDER — CYCLOBENZAPRINE HCL 10 MG PO TABS
10.00 | ORAL_TABLET | ORAL | Status: DC
Start: ? — End: 2018-04-23

## 2018-04-25 ENCOUNTER — Ambulatory Visit: Payer: Medicaid Other | Admitting: Neurology

## 2018-05-28 ENCOUNTER — Encounter: Payer: Self-pay | Admitting: Neurology

## 2018-05-28 ENCOUNTER — Other Ambulatory Visit: Payer: Self-pay

## 2018-05-28 ENCOUNTER — Ambulatory Visit: Payer: Medicaid Other | Admitting: Neurology

## 2018-05-28 ENCOUNTER — Encounter (HOSPITAL_COMMUNITY): Payer: Self-pay | Admitting: Emergency Medicine

## 2018-05-28 ENCOUNTER — Ambulatory Visit (HOSPITAL_COMMUNITY)
Admission: EM | Admit: 2018-05-28 | Discharge: 2018-05-28 | Disposition: A | Discharge status: Home / Self Care | Payer: Medicaid Other | Attending: Family Medicine | Admitting: Family Medicine

## 2018-05-28 DIAGNOSIS — K219 Gastro-esophageal reflux disease without esophagitis: Secondary | ICD-10-CM

## 2018-05-28 MED ORDER — GI COCKTAIL ~~LOC~~
30.0000 mL | Freq: Once | ORAL | Status: AC
  Administered 2018-05-28: 30 mL via ORAL

## 2018-05-28 MED ORDER — GI COCKTAIL ~~LOC~~
ORAL | Status: AC
  Filled 2018-05-28: qty 30

## 2018-05-28 MED ORDER — FAMOTIDINE 20 MG PO TABS: 20.0000 mg | ORAL_TABLET | Freq: Two times a day (BID) | ORAL | 0 refills | Status: AC

## 2018-05-28 NOTE — ED Triage Notes (Signed)
[facility] a [facility]. The patient stated that he has not taken his medications.

## 2018-05-28 NOTE — ED Provider Notes (Signed)
[facility]    CSN: [id number] Arrival date & time: 05/28/18  1011     History   Chief Complaint Chief Complaint  Patient presents with  . Gastroesophageal Reflux    HPI Josie SaundersLacy Vassar is a 52 y.o. male.   52 year old male with history of seizures comes in for evaluation of acid reflux starting last night.  States had [employer] for the first time, and started having burning sensation to the epigastric region that is traveling up his chest.  He denies abdominal pain, nausea, vomiting.  Denies chest pain, shortness of breath, palpitations.  Denies dizziness, weakness, syncope.  States has the symptoms in the past, and was given GI cocktail with good relief.  He had taken medications for GERD in the past, but has not been taking it recently.  Is a former smoker, no alcohol use, no regular caffeine use.  Denies personal history of heart disease. States mother had stroke, denies history of MI. Denies exertional chest pain, dyspnea on exertion.      Past Medical History:  Diagnosis Date  . Anxiety   . Seizures [facility](HCC)     Patient Active Problem List   Diagnosis Date Noted  . Dysphagia 02/20/2018  . Bilateral chronic knee pain 02/27/2017  . 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 No pertinent surgical history.     Home Medications    Prior to Admission medications   Medication Sig Start Date End Date Taking? Authorizing Provider  amLODipine (NORVASC) 10 MG tablet Take 10 mg by mouth daily. 11/15/17  Yes [provider]  cyclobenzaprine (FLEXERIL) 10 MG tablet Take 10 mg by mouth 3 (three) times daily as needed for muscle spasms.  01/16/18  Yes [provider]  phenytoin (DILANTIN) 100 MG ER capsule Take 200 mg by mouth 3 (three) times daily.   Yes [provider]  topiramate (TOPAMAX) 100 MG tablet Take 1 tablet (100 mg total) by mouth 2 (two) times daily. 02/20/18  Yes Sater, Pearletha Furlichard A, MD    famotidine (PEPCID) 20 MG tablet Take 1 tablet (20 mg total) by mouth 2 (two) times daily. 05/28/18   Belinda FisherYu, Amy V, PA-C    Family History History reviewed. No pertinent family history.  Social History Social History   Tobacco Use  . Smoking status: Former Smoker    Types: Cigarettes    Last attempt to quit: 12/02/2017    Years since quitting: 0.4  . Smokeless tobacco: Never Used  Substance Use Topics  . Alcohol use: Yes    Comment: occasionally  . Drug use: Yes    Comment: synthetic marijuana     Allergies   Depakote [divalproex sodium]; Levetiracetam; and Mushroom extract complex   Review of Systems Review of Systems  Reason unable to perform ROS: See HPI as above.     Physical Exam Triage Vital Signs ED Triage Vitals [05/28/18 1057]  Enc Vitals Group     BP (!) 136/104     Pulse Rate 80     Resp 20     Temp 98 F (36.7 C)     Temp Source Oral     SpO2 100 %     Weight      Height      Head Circumference      Peak Flow      Pain Score 7     Pain Loc      Pain Edu?  Excl. in GC?    No data found.  Updated Vital Signs BP (!) 136/104 (BP Location: Left Arm)   Pulse 80   Temp 98 F (36.7 C) (Oral)   Resp 20   SpO2 100%   Physical Exam  Constitutional: He is oriented to person, place, and time. He appears well-developed and well-nourished. No distress.  HENT:  Head: Normocephalic and atraumatic.  Cardiovascular: Normal rate, regular rhythm and normal heart sounds. Exam reveals no gallop and no friction rub.  No murmur heard. Pulmonary/Chest: Effort normal and breath sounds normal. No stridor. No respiratory distress. He has no wheezes. He has no rales. He exhibits no tenderness.  Abdominal: Soft. Bowel sounds are normal. He exhibits no mass. There is no tenderness. There is no rebound and no guarding.  Neurological: He is alert and oriented to person, place, and time.  Skin: Skin is warm and dry. He is not diaphoretic.  Psychiatric: He has a  normal mood and affect. His behavior is normal. Judgment normal.     UC Treatments / Results  Labs (all labs ordered are listed, but only abnormal results are displayed) Labs Reviewed - No data to display  EKG None  Radiology No results found.  Procedures Procedures (including critical care time)  Medications Ordered in UC Medications  gi cocktail (Maalox,Lidocaine,Donnatal) (30 mLs Oral Given 05/28/18 1139)    Initial Impression / Assessment and Plan / UC Course  I have reviewed the triage vital signs and the nursing notes.  Pertinent labs & imaging results that were available during my care of the patient were reviewed by me and considered in my medical decision making (see chart for details).    Patient was at the emergency department earlier for similar symptoms, but eloped prior to evaluation.  Discussed with patient, cannot rule out cardiac causes of symptoms, patient declined EKG at this time.  He is sitting comfortably on exam table without distress or diaphoresis.  His vitals are stable, and exam is unremarkable.  Will treat for GERD with GI cocktail, and famotidine at home as needed.  Discussed foods to avoid.  Return precautions given.  Patient expresses understanding and agrees to plan.  Final Clinical Impressions(s) / UC Diagnoses   Final diagnoses:  Gastroesophageal reflux disease without esophagitis    ED Prescriptions    Medication Sig Dispense Auth. Provider   famotidine (PEPCID) 20 MG tablet Take 1 tablet (20 mg total) by mouth 2 (two) times daily. 20 tablet Threasa Alpha, [place] 05/28/18 1206

## 2018-05-28 NOTE — Discharge Instructions (Signed)
Start famotidine as directed. Avoid spicy/fatty foods for now. Keep hydrated, your urine should be clear to pale yellow in color. As discussed, cannot rule out heart causes, please follow up with your PCP for further evaluation needed. If experiencing chest pain, shortness of breath, palpitation, weakness/dizziness, go to the emergency department for further evaluation needed.

## 2018-10-12 DEATH — deceased

## 2020-01-16 IMAGING — CT CT ANGIO CHEST
2 of 7 series · 18 of 46 positions shown · IV contrast (iopamidol)
Comparison: None.

CLINICAL DATA: Shortness of breath. Pulmonary embolism suspected.
Cough and hemoptysis after smoking synthetic cannabis

EXAM:
CT ANGIOGRAPHY CHEST WITH CONTRAST
TECHNIQUE: Multidetector CT imaging of the chest was performed using the
standard protocol during bolus administration of intravenous
contrast. Multiplanar CT image reconstructions and MIPs were
obtained to evaluate the vascular anatomy.
CONTRAST:  100mL [id number] IOPAMIDOL ([id number]) INJECTION 76%

[Series 8: thins · axial · 0.61mm/px · z∈[+1316,+1550]mm · 15 of 377 slices shown]
[im 21/377  lung]
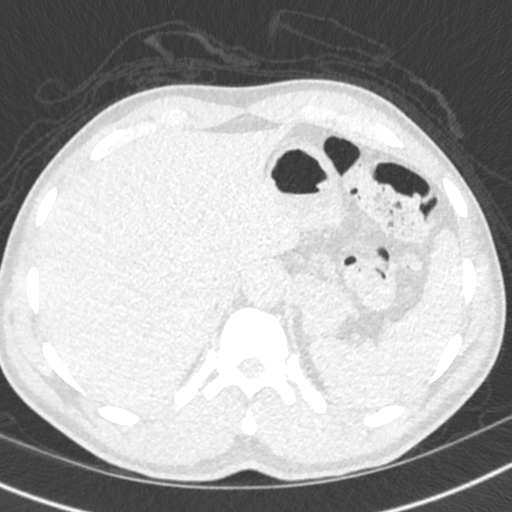
[im 42/377  soft-tissue]
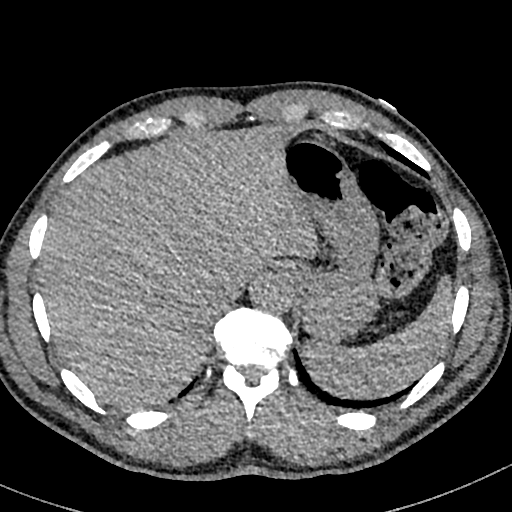
[im 63/377  lung]
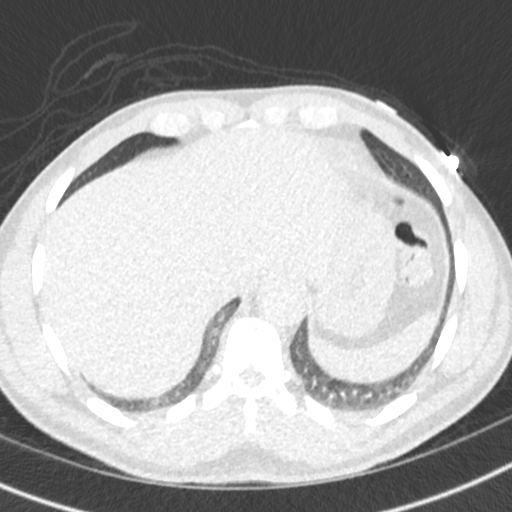
[im 84/377  soft-tissue]
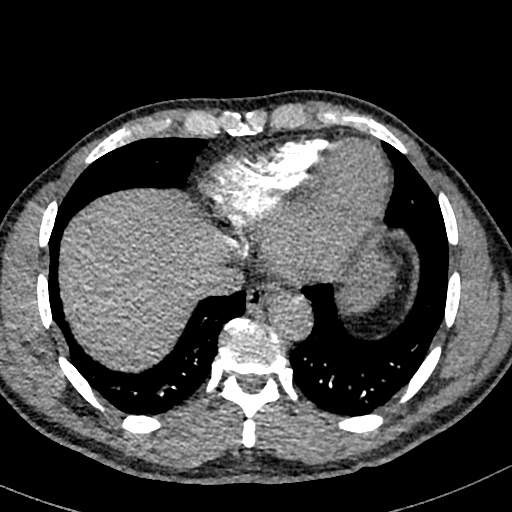
[im 126/377  lung]
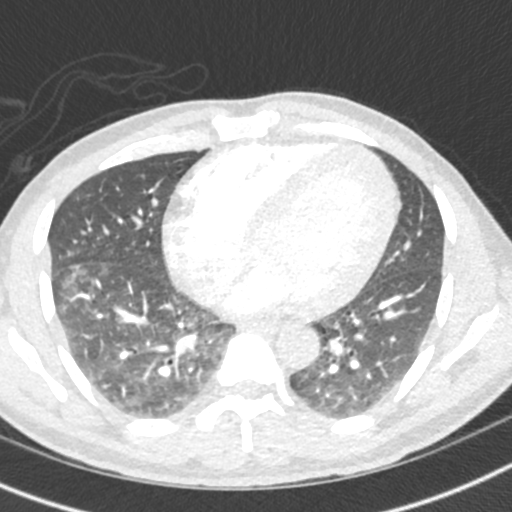
[im 147/377  soft-tissue]
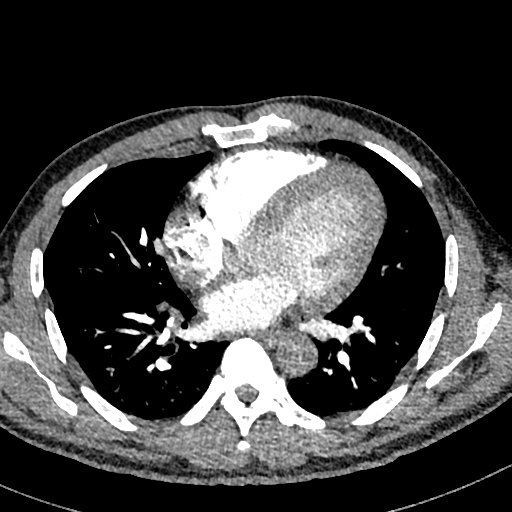
[im 168/377  lung]
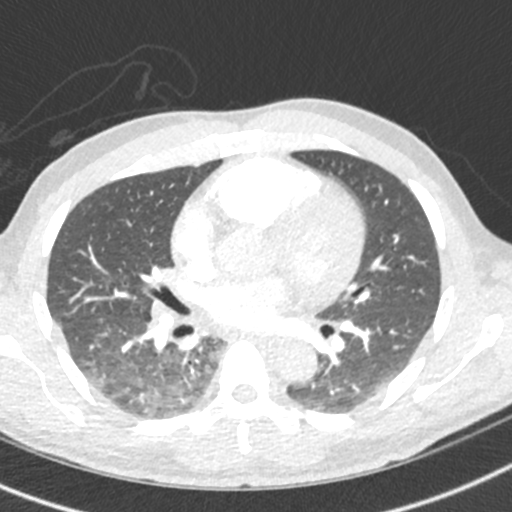
[im 189/377  soft-tissue]
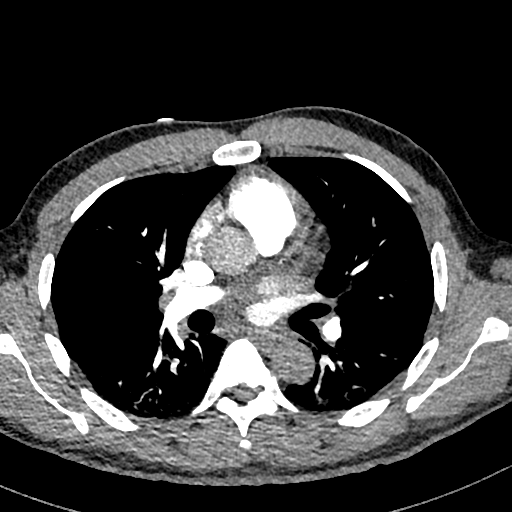
[im 209/377  lung]
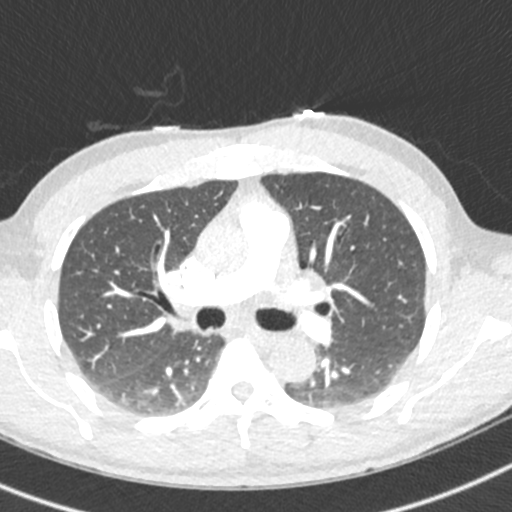
[im 230/377  soft-tissue]
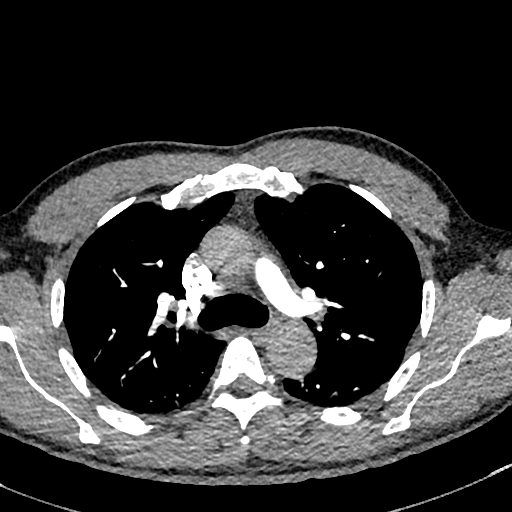
[im 251/377  lung]
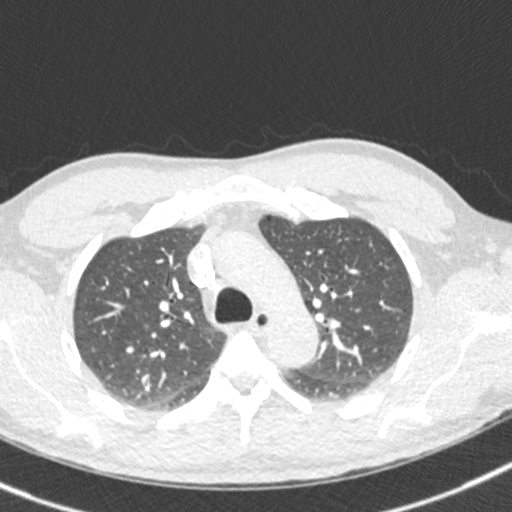
[im 293/377  soft-tissue]
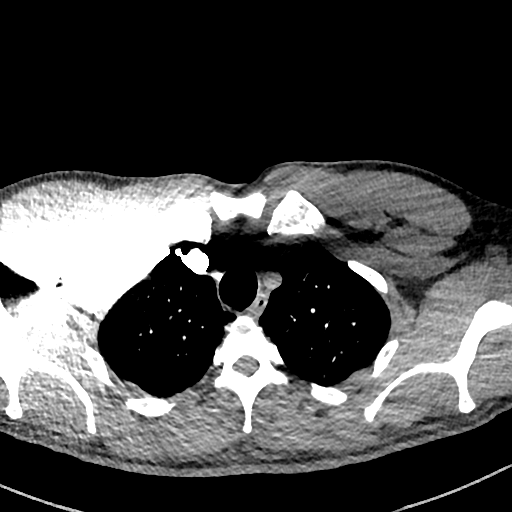
[im 314/377  lung]
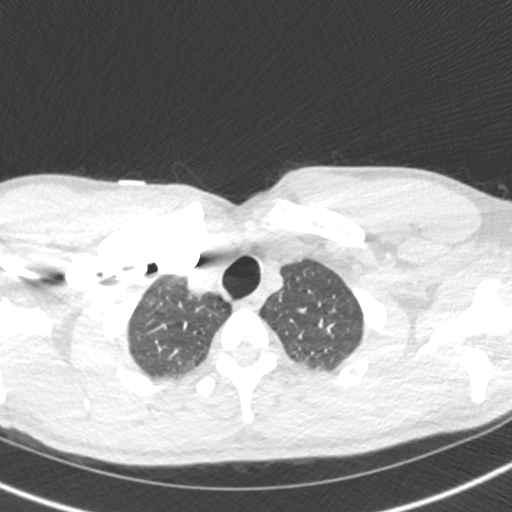
[im 335/377  soft-tissue]
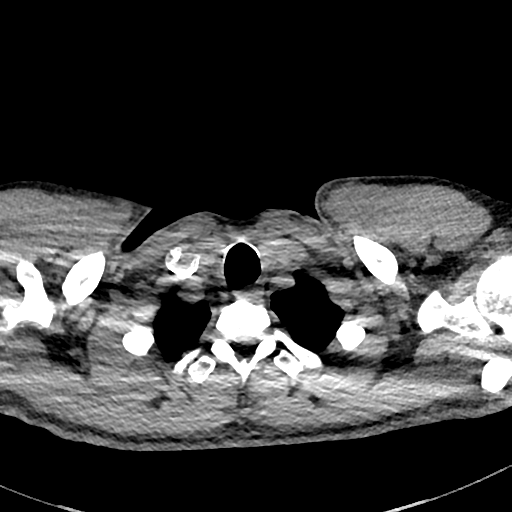
[im 356/377  lung]
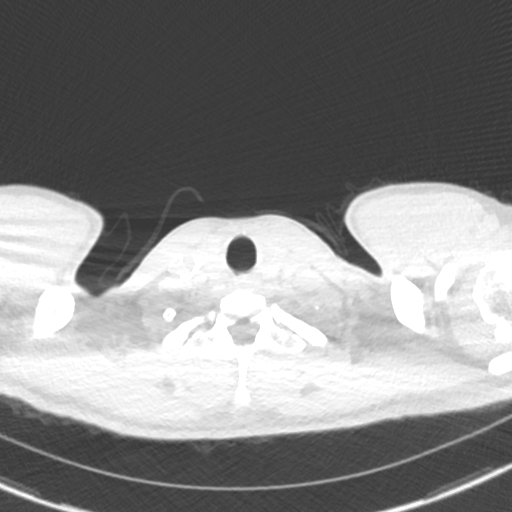

[Series 9: cor · coronal · 0.53mm/px · 3 of 126 slices shown]
[im 32/126  soft-tissue]
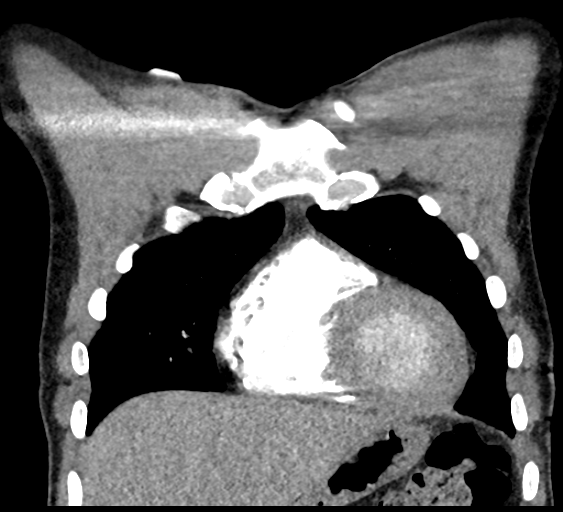
[im 63/126  soft-tissue]
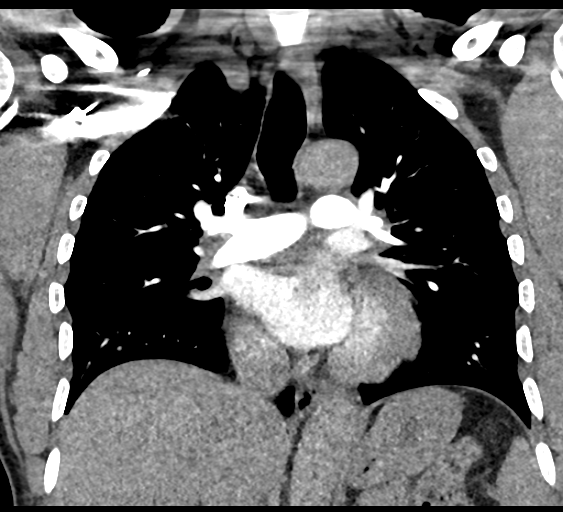
[im 94/126  soft-tissue]
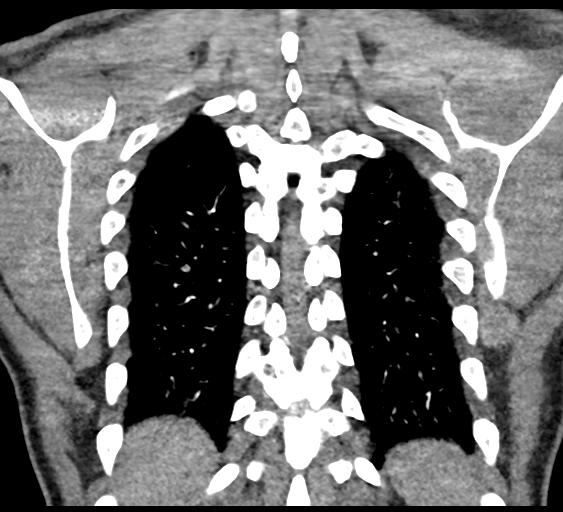

[18 of 46 positions shown; findings below may reference images not displayed]

FINDINGS: Cardiovascular: Satisfactory opacification of the pulmonary arteries
to the segmental level. No evidence of pulmonary embolism. Normal
heart size. No pericardial effusion. Atherosclerotic calcifications
seen along the left circumflex.

Mediastinum/Nodes: Right hilar adenopathy, presumably related to the
ipsilateral airspace disease. Nodes measure up to 13 mm in short
axis

Lungs/Pleura: Clustered airspace nodules in the right lower lobe.
There is inhalational history but would expect more generalized
airspace disease for a pneumonitis. Mild paraseptal emphysema. Mild
generalized increased density of the lower lobes favoring
atelectasis.

Upper Abdomen: Negative

Musculoskeletal: No acute finding

Review of the MIP images confirms the above findings.
IMPRESSION: 1. Right lower lobe airspace disease which could reflect pneumonia
or alveolar hemorrhage given the clinical history. Ipsilateral hilar
adenopathy favors an infectious/inflammatory process. These findings
are radiographic occult but chest x-ray follow-up in 3-4 weeks is
still recommended.
2. Coronary atherosclerotic calcification, mild but notable for age.

## 2020-03-25 IMAGING — DX DG CHEST 2V
2 series · 2 of 2 positions shown · non-contrast
Comparison: Chest radiograph performed 12/05/2017, and CTA of the
chest performed 12/06/2017

CLINICAL DATA: Acute onset of central chest pain after recent chest
compressions.

EXAM:
CHEST - 2 VIEW

[chest pa]
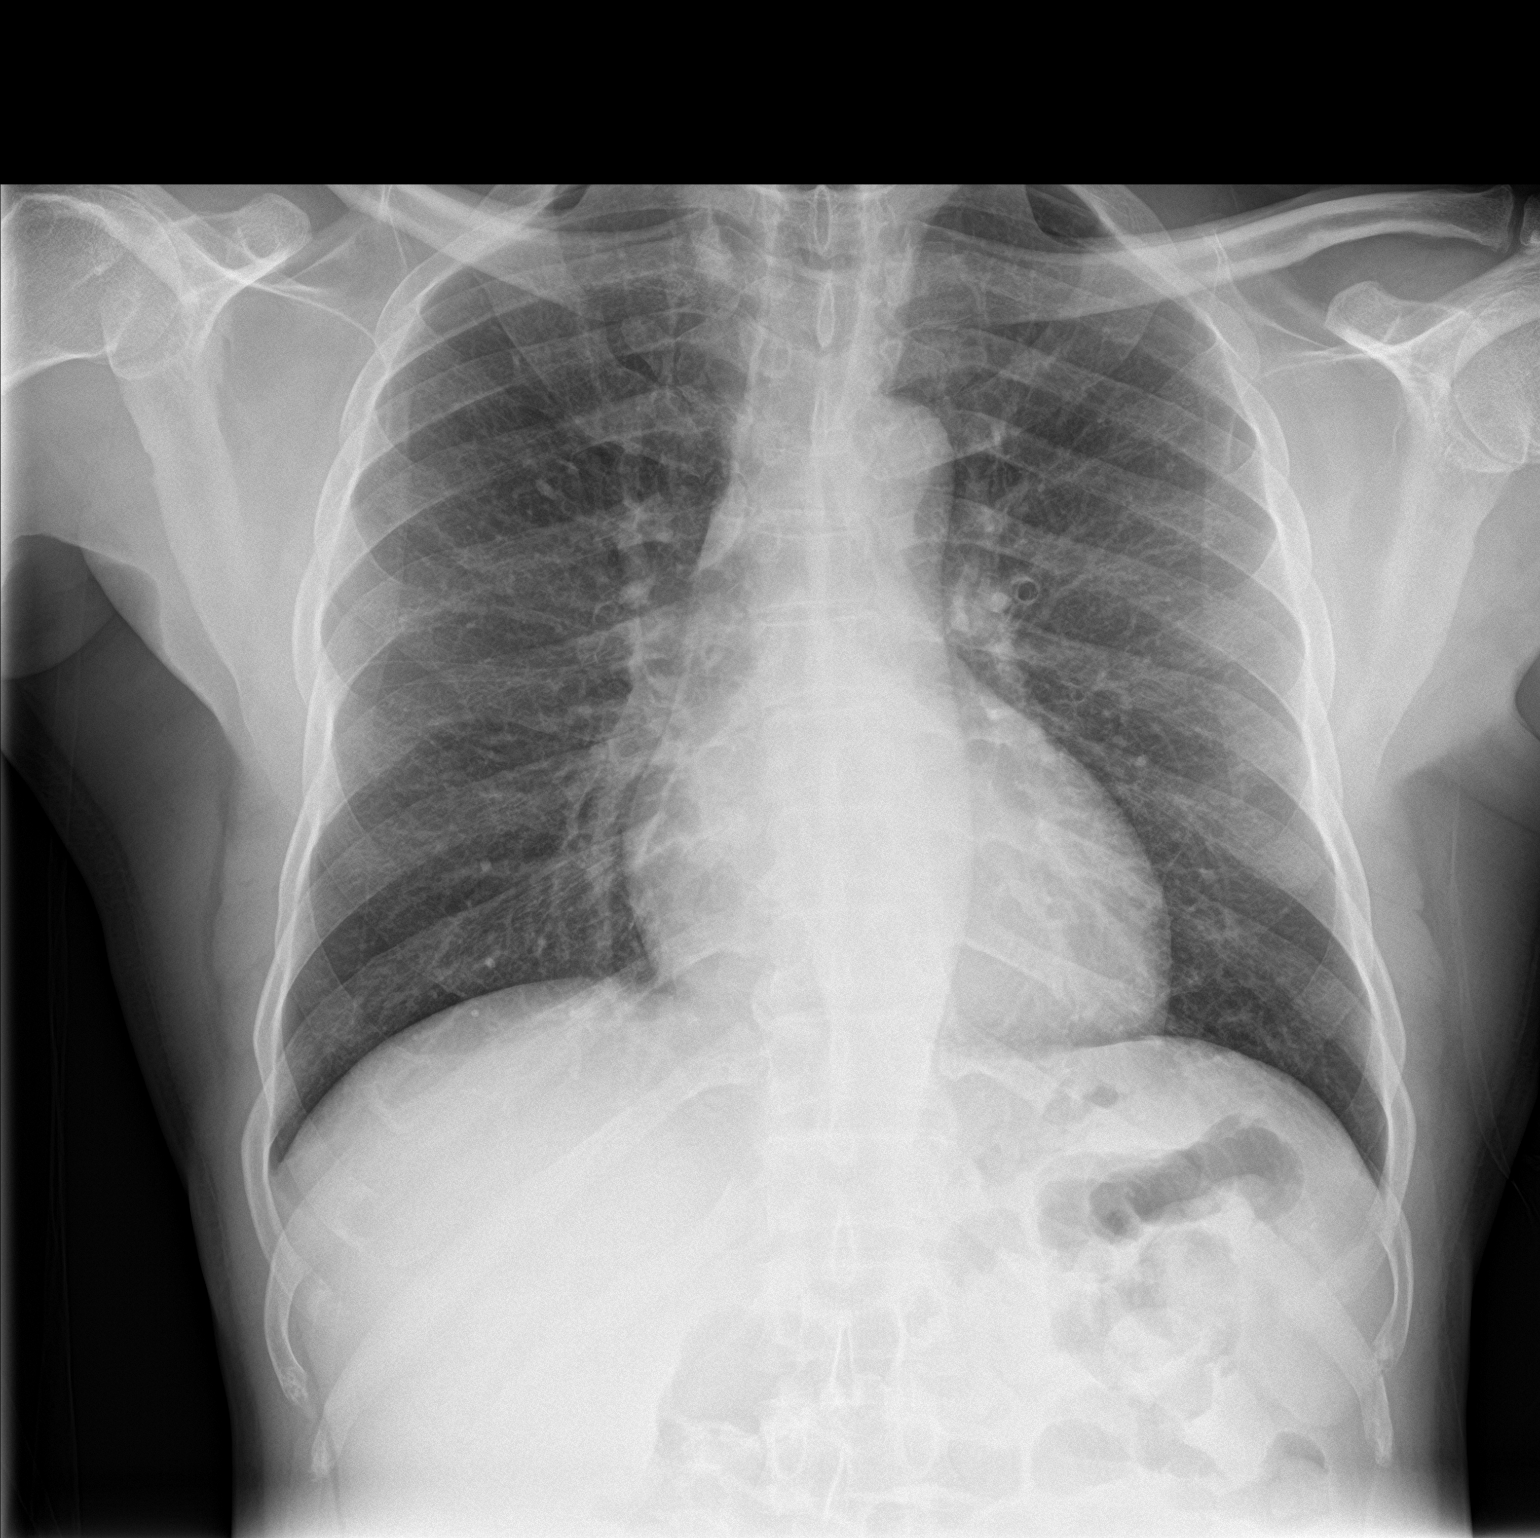

[chest lat]
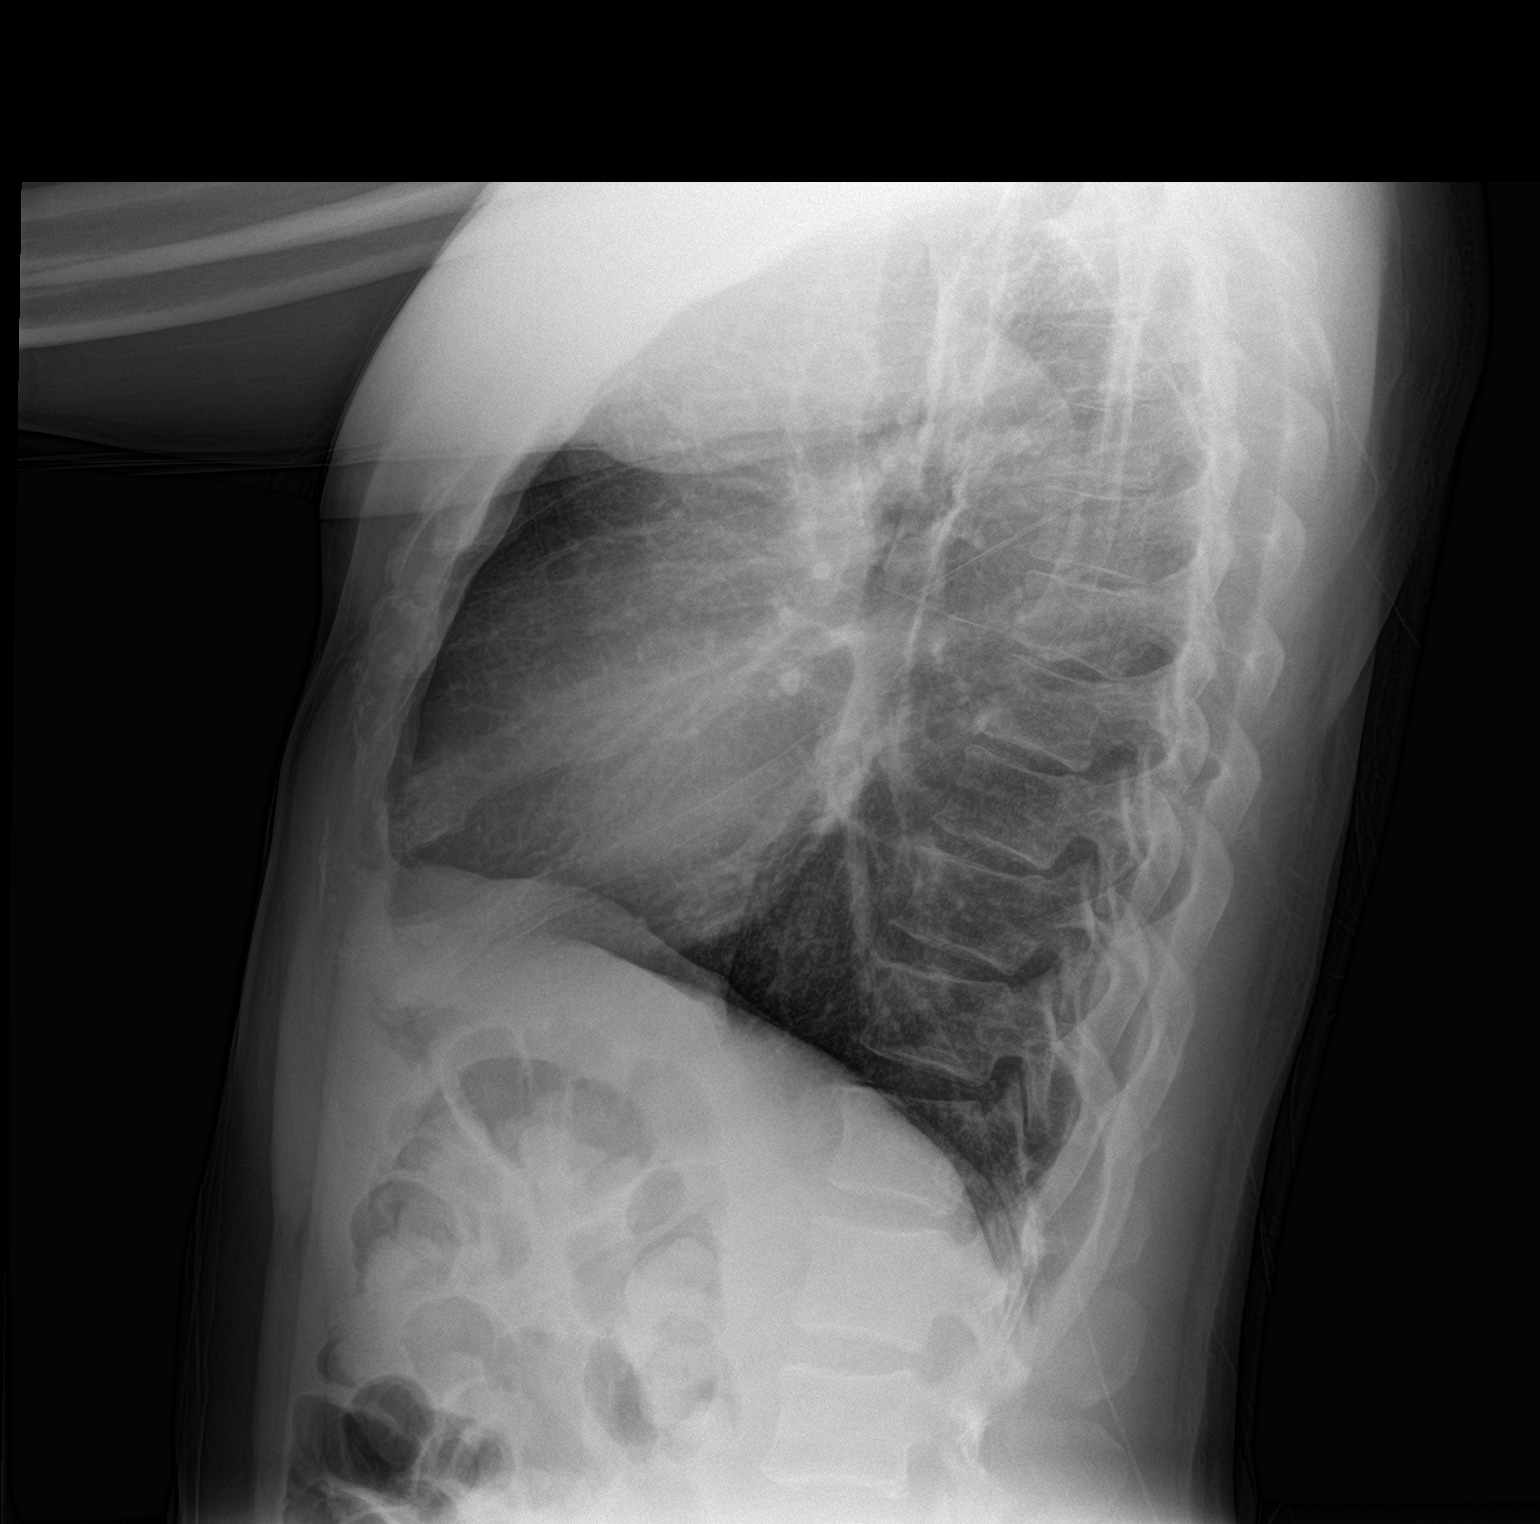

[2 of 2 positions shown; findings below may reference images not displayed]

FINDINGS: The lungs are well-aerated. Mild peribronchial thickening is noted.
There is no evidence of focal opacification, pleural effusion or
pneumothorax.

The heart is normal in size; the mediastinal contour is within
normal limits. No acute osseous abnormalities are seen.
IMPRESSION: Mild peribronchial thickening noted; lungs otherwise clear. No
displaced rib fracture seen.
